# Patient Record
Sex: Female | Born: 1980 | Race: White | Marital: Married | Smoking: Never smoker
Health system: Southern US, Community
[De-identification: ages and names within clinical notes are randomized; demographics above are authoritative.]

## PROBLEM LIST (undated history)

## (undated) DIAGNOSIS — G43909 Migraine, unspecified, not intractable, without status migrainosus: Secondary | ICD-10-CM

## (undated) DIAGNOSIS — E282 Polycystic ovarian syndrome: Secondary | ICD-10-CM

## (undated) HISTORY — PX: CHOLECYSTECTOMY: SHX55

## (undated) HISTORY — PX: TONSILLECTOMY: SUR1361

---

## 2007-07-21 HISTORY — PX: NASAL SINUS SURGERY: SHX719

## 2009-07-20 HISTORY — PX: TUBAL LIGATION: SHX77

## 2009-07-20 HISTORY — PX: WRIST FRACTURE SURGERY: SHX121

## 2013-08-02 ENCOUNTER — Other Ambulatory Visit: Payer: Self-pay | Admitting: Family Medicine

## 2013-08-02 DIAGNOSIS — R51 Headache: Secondary | ICD-10-CM

## 2013-08-02 DIAGNOSIS — R519 Headache, unspecified: Secondary | ICD-10-CM

## 2013-08-02 DIAGNOSIS — H9319 Tinnitus, unspecified ear: Secondary | ICD-10-CM

## 2013-08-02 DIAGNOSIS — R42 Dizziness and giddiness: Secondary | ICD-10-CM

## 2013-08-07 ENCOUNTER — Other Ambulatory Visit: Payer: Self-pay

## 2013-08-09 ENCOUNTER — Ambulatory Visit
Admission: RE | Admit: 2013-08-09 | Discharge: 2013-08-09 | Disposition: A | Discharge status: Home / Self Care | Payer: 59 | Source: Ambulatory Visit | Attending: Family Medicine | Admitting: Family Medicine

## 2013-08-09 DIAGNOSIS — H9319 Tinnitus, unspecified ear: Secondary | ICD-10-CM

## 2013-08-09 DIAGNOSIS — R51 Headache: Secondary | ICD-10-CM

## 2013-08-09 DIAGNOSIS — R42 Dizziness and giddiness: Secondary | ICD-10-CM

## 2013-08-09 DIAGNOSIS — R519 Headache, unspecified: Secondary | ICD-10-CM

## 2013-08-09 MED ORDER — GADOBENATE DIMEGLUMINE 529 MG/ML IV SOLN
13.0000 mL | Freq: Once | INTRAVENOUS | Status: AC | PRN
  Administered 2013-08-09: 13 mL via INTRAVENOUS

## 2016-04-25 ENCOUNTER — Encounter (HOSPITAL_BASED_OUTPATIENT_CLINIC_OR_DEPARTMENT_OTHER): Payer: Self-pay | Admitting: *Deleted

## 2016-04-25 ENCOUNTER — Emergency Department (HOSPITAL_BASED_OUTPATIENT_CLINIC_OR_DEPARTMENT_OTHER)
Admission: EM | Admit: 2016-04-25 | Discharge: 2016-04-25 | Disposition: A | Discharge status: Home / Self Care | Payer: 59 | Attending: Emergency Medicine | Admitting: Emergency Medicine

## 2016-04-25 ENCOUNTER — Emergency Department (HOSPITAL_BASED_OUTPATIENT_CLINIC_OR_DEPARTMENT_OTHER): Payer: 59

## 2016-04-25 DIAGNOSIS — R109 Unspecified abdominal pain: Secondary | ICD-10-CM | POA: Diagnosis present

## 2016-04-25 DIAGNOSIS — K529 Noninfective gastroenteritis and colitis, unspecified: Secondary | ICD-10-CM

## 2016-04-25 DIAGNOSIS — Z79899 Other long term (current) drug therapy: Secondary | ICD-10-CM | POA: Insufficient documentation

## 2016-04-25 HISTORY — DX: Migraine, unspecified, not intractable, without status migrainosus: G43.909

## 2016-04-25 HISTORY — DX: Polycystic ovarian syndrome: E28.2

## 2016-04-25 LAB — COMPREHENSIVE METABOLIC PANEL
ALBUMIN: 4.2 g/dL (ref 3.5–5.0)
ALT: 31 U/L (ref 14–54)
ANION GAP: 8 (ref 5–15)
AST: 28 U/L (ref 15–41)
Alkaline Phosphatase: 40 U/L (ref 38–126)
BILIRUBIN TOTAL: 0.9 mg/dL (ref 0.3–1.2)
BUN: 7 mg/dL (ref 6–20)
CO2: 26 mmol/L (ref 22–32)
Calcium: 8.8 mg/dL — ABNORMAL LOW (ref 8.9–10.3)
Chloride: 105 mmol/L (ref 101–111)
Creatinine, Ser: 0.62 mg/dL (ref 0.44–1.00)
GFR calc Af Amer: >60 mL/min (ref >60)
GFR calc non Af Amer: >60 mL/min (ref >60)
GLUCOSE: 90 mg/dL (ref 65–99)
POTASSIUM: 3.6 mmol/L (ref 3.5–5.1)
SODIUM: 139 mmol/L (ref 135–145)
TOTAL PROTEIN: 6.9 g/dL (ref 6.5–8.1)

## 2016-04-25 LAB — CBC
HEMATOCRIT: 38.9 % (ref 36.0–46.0)
HEMOGLOBIN: 13.5 g/dL (ref 12.0–15.0)
MCH: 30.9 pg (ref 26.0–34.0)
MCHC: 34.7 g/dL (ref 30.0–36.0)
MCV: 89 fL (ref 78.0–100.0)
Platelets: 265 10*3/uL (ref 150–400)
RBC: 4.37 MIL/uL (ref 3.87–5.11)
RDW: 11.8 % (ref 11.5–15.5)
WBC: 5.3 10*3/uL (ref 4.0–10.5)

## 2016-04-25 LAB — URINALYSIS, ROUTINE W REFLEX MICROSCOPIC
BILIRUBIN URINE: NEGATIVE
Glucose, UA: NEGATIVE mg/dL
Hgb urine dipstick: NEGATIVE
Ketones, ur: NEGATIVE mg/dL
Leukocytes, UA: NEGATIVE
NITRITE: NEGATIVE
PH: 6 (ref 5.0–8.0)
PROTEIN: NEGATIVE mg/dL
SPECIFIC GRAVITY, URINE: 1.007 (ref 1.005–1.030)

## 2016-04-25 LAB — LIPASE, BLOOD: Lipase: 21 U/L (ref 11–51)

## 2016-04-25 LAB — PREGNANCY, URINE: Preg Test, Ur: NEGATIVE

## 2016-04-25 MED ORDER — PROMETHAZINE HCL 25 MG PO TABS: 25.0000 mg | ORAL_TABLET | Freq: Three times a day (TID) | ORAL | 0 refills | Status: AC | PRN

## 2016-04-25 MED ORDER — HYDROCODONE-ACETAMINOPHEN 5-325 MG PO TABS: 1.0000 | ORAL_TABLET | Freq: Four times a day (QID) | ORAL | 0 refills | Status: AC | PRN

## 2016-04-25 MED ORDER — ONDANSETRON HCL 4 MG/2ML IJ SOLN
4.0000 mg | Freq: Once | INTRAMUSCULAR | Status: AC
  Administered 2016-04-25: 4 mg via INTRAVENOUS
  Filled 2016-04-25: qty 2

## 2016-04-25 MED ORDER — SODIUM CHLORIDE 0.9 % IV BOLUS (SEPSIS)
1000.0000 mL | Freq: Once | INTRAVENOUS | Status: AC
  Administered 2016-04-25: 1000 mL via INTRAVENOUS

## 2016-04-25 MED ORDER — SUCRALFATE 1 G PO TABS: 1.0000 g | ORAL_TABLET | Freq: Three times a day (TID) | ORAL | 0 refills | Status: AC

## 2016-04-25 MED ORDER — MORPHINE SULFATE (PF) 4 MG/ML IV SOLN
4.0000 mg | Freq: Once | INTRAVENOUS | Status: AC
  Administered 2016-04-25: 4 mg via INTRAVENOUS
  Filled 2016-04-25: qty 1

## 2016-04-25 MED ORDER — IOPAMIDOL (ISOVUE-300) INJECTION 61%
100.0000 mL | Freq: Once | INTRAVENOUS | Status: AC | PRN
  Administered 2016-04-25: 100 mL via INTRAVENOUS

## 2016-04-25 NOTE — ED Triage Notes (Signed)
Pt reports mid-upper abd pain that radiates to periumbilical area. States it's been intermittent x1wk with associated diarrhea. Denies fever; reports n/v x1 this am.

## 2016-04-25 NOTE — ED Provider Notes (Signed)
[facility]   CSN: [id number] Arrival date & time: 04/25/16  1032     History   Chief Complaint Chief Complaint  Patient presents with  . Abdominal Pain    HPI Barbara Austin is a 35 y.o. female.  HPI Patient presents to the emergency department with nausea, vomiting with abdominal pain over the last 5 days.  The patient states that she was seen by her GYN doctor and prescribed a medication for a yeast infection vaginally.  The patient states that she saw her primary care doctor also prescribed her something for GERD-like symptoms.  Patient states that nothing seems to make her condition better or worse.  She states that she did have an episode of vomiting this morning, along with increasing pain Past Medical History:  Diagnosis Date  . Migraines   . Polycystic ovary disease     There are no active problems to display for this patient.   Past Surgical History:  Procedure Laterality Date  . CHOLECYSTECTOMY    . NASAL SINUS SURGERY  2009  . TONSILLECTOMY    . TUBAL LIGATION  2011  . WRIST FRACTURE SURGERY Right 2011    OB History    No data available       Home Medications    Prior to Admission medications   Medication Sig Start Date End Date Taking? Authorizing Provider  fexofenadine (ALLEGRA) 30 MG tablet Take 30 mg by mouth 2 (two) times daily.   Yes Historical Provider, MD  omeprazole (PRILOSEC) 20 MG capsule Take 20 mg by mouth daily.   Yes Historical Provider, MD    Family History No family history on file.  Social History Social History  Substance Use Topics  . Smoking status: Never Smoker  . Smokeless tobacco: Never Used  . Alcohol use Yes     Comment: every other day     Allergies   Penicillins   Review of Systems Review of Systems  All other systems negative except as documented in the HPI. All pertinent positives and negatives as reviewed in the HPI. Physical Exam Updated Vital Signs BP 119/73 (BP Location:  Left Arm)   Pulse 64   Temp 98.3 F (36.8 C) (Oral)   Resp 18   Ht 5\' 4"  (1.626 m)   Wt 65.3 kg   LMP 03/25/2016   SpO2 98%   BMI 24.72 kg/m   Physical Exam  Constitutional: She is oriented to person, place, and time. She appears well-developed and well-nourished. No distress.  HENT:  Head: Normocephalic and atraumatic.  Mouth/Throat: Oropharynx is clear and moist.  Eyes: Pupils are equal, round, and reactive to light.  Neck: Normal range of motion. Neck supple.  Cardiovascular: Normal rate, regular rhythm and normal heart sounds.  Exam reveals no gallop and no friction rub.   No murmur heard. Pulmonary/Chest: Effort normal and breath sounds normal. No respiratory distress. She has no wheezes.  Abdominal: Soft. Bowel sounds are normal. She exhibits no distension and no mass. There is tenderness. There is no rebound and no guarding.  Neurological: She is alert and oriented to person, place, and time. She exhibits normal muscle tone. Coordination normal.  Skin: Skin is warm and dry. Capillary refill takes less than 2 seconds. No rash noted. No erythema.  Psychiatric: She has a normal mood and affect. Her behavior is normal.  Nursing note and vitals reviewed.    ED Treatments / Results  Labs (all labs ordered are listed, but only  abnormal results are displayed) Labs Reviewed  COMPREHENSIVE METABOLIC PANEL - Abnormal; Notable for the following:       Result Value   Calcium 8.8 (*)    All other components within normal limits  LIPASE, BLOOD  CBC  URINALYSIS, ROUTINE W REFLEX MICROSCOPIC (NOT AT [id number])  PREGNANCY, URINE    EKG  EKG Interpretation None       Radiology Ct Abdomen Pelvis W Contrast  Result Date: 04/25/2016 CLINICAL DATA:  Periumbilical and epigastric abdominal pain, nausea and vomiting. EXAM: CT ABDOMEN AND PELVIS WITH CONTRAST TECHNIQUE: Multidetector CT imaging of the abdomen and pelvis was performed using the standard protocol following bolus  administration of intravenous contrast. CONTRAST:  100mL ISOVUE-300 IOPAMIDOL (ISOVUE-300) INJECTION 61% COMPARISON:  None. FINDINGS: Lower chest: No acute abnormality. Hepatobiliary: No focal liver abnormality is seen. Status post cholecystectomy. No biliary dilatation. Pancreas: Unremarkable. No pancreatic ductal dilatation or surrounding inflammatory changes. Spleen: Normal in size without focal abnormality. Adrenals/Urinary Tract: Adrenal glands are unremarkable. Kidneys are normal, without renal calculi, focal lesion, or hydronephrosis. Bladder is unremarkable. Stomach/Bowel: Stomach is within normal limits. Appendix appears normal. No evidence of bowel wall thickening, distention, or obstruction. No free air or abscess. Nonspecific fluid in some nondilated mid to distal small bowel loops may be reflective of mild enteritis. Vascular/Lymphatic: No significant vascular findings are present. No enlarged abdominal or pelvic lymph nodes. Reproductive: Uterus and bilateral adnexa are unremarkable. Migrated right-sided tubal ligation clip lies in the anterior pelvis. Other: No abdominal wall hernia or abnormality. No abdominopelvic ascites. Musculoskeletal: No acute or significant osseous findings. IMPRESSION: No significant acute process identified in the abdomen or pelvis. Mild fluid filled nondilated small bowel may be reflective of enteritis. Electronically Signed   By: Irish LackGlenn  Yamagata M.D.   On: 04/25/2016 13:18    Procedures Procedures (including critical care time)  Medications Ordered in ED Medications  sodium chloride 0.9 % bolus 1,000 mL (1,000 mLs Intravenous New Bag/Given 04/25/16 1202)  ondansetron (ZOFRAN) injection 4 mg (4 mg Intravenous Given 04/25/16 1203)  morphine 4 MG/ML injection 4 mg (4 mg Intravenous Given 04/25/16 1204)  iopamidol (ISOVUE-300) 61 % injection 100 mL (100 mLs Intravenous Contrast Given 04/25/16 1229)     Initial Impression / Assessment and Plan / ED Course  I have  reviewed the triage vital signs and the nursing notes.  Pertinent labs & imaging results that were available during my care of the patient were reviewed by me and considered in my medical decision making (see chart for details).  Clinical Course   The patient is advised of the plan and all questions answered.  Did advise her that this could just be a gastroenteritis, although she could have a worsening condition that is yet to fully declare itself and told her to return here for any changes in her condition.  Patient be sent home with symptomatic control told to follow with her primary care doctor.  Patient voices an understanding and all questions were answered  Final Clinical Impressions(s) / ED Diagnoses   Final diagnoses:  None    New Prescriptions New Prescriptions   No medications on file     Charlestine NightChristopher Leaann Nevils, PA-C 04/25/16 1644    Pricilla LovelessScott Goldston, MD 05/01/16 1739

## 2016-04-25 NOTE — Discharge Instructions (Signed)
Follow-up with your primary care doctor.  Return here as needed for any worsening in your condition slowly increase her fluid intake and rest as much as possible

## 2016-04-25 NOTE — ED Notes (Signed)
PA at bedside.

## 2018-02-18 ENCOUNTER — Other Ambulatory Visit: Payer: Self-pay | Admitting: Orthopedic Surgery

## 2018-02-18 DIAGNOSIS — M5416 Radiculopathy, lumbar region: Secondary | ICD-10-CM

## 2018-03-02 ENCOUNTER — Other Ambulatory Visit: Payer: 59

## 2018-03-11 ENCOUNTER — Other Ambulatory Visit: Payer: Self-pay

## 2018-03-11 ENCOUNTER — Inpatient Hospital Stay: Admission: RE | Admit: 2018-03-11 | Payer: Self-pay | Source: Ambulatory Visit

## 2018-03-24 ENCOUNTER — Other Ambulatory Visit: Payer: Self-pay

## 2018-04-15 ENCOUNTER — Other Ambulatory Visit: Payer: Self-pay | Admitting: Orthopedic Surgery

## 2018-04-15 DIAGNOSIS — M5416 Radiculopathy, lumbar region: Secondary | ICD-10-CM

## 2018-04-29 ENCOUNTER — Ambulatory Visit
Admission: RE | Admit: 2018-04-29 | Discharge: 2018-04-29 | Disposition: A | Discharge status: Home / Self Care | Payer: Managed Care, Other (non HMO) | Source: Ambulatory Visit | Attending: Orthopedic Surgery | Admitting: Orthopedic Surgery

## 2018-04-29 DIAGNOSIS — M5416 Radiculopathy, lumbar region: Secondary | ICD-10-CM

## 2019-03-31 IMAGING — MR MR LUMBAR SPINE W/O CM
4 of 5 series · 26 of 48 positions shown · non-contrast
Comparison: None.

CLINICAL DATA: Initial evaluation for centralized low back pain
radiating into the left lower extremity for 10 months.

EXAM:
MRI LUMBAR SPINE WITHOUT CONTRAST
TECHNIQUE: Multiplanar, multisequence MR imaging of the lumbar spine was
performed. No intravenous contrast was administered.

[Series 3: T2 · sagittal · 4.0mm · 0.55mm/px · 6 of 12 slices shown (1 of 2)]
[im 1/12]
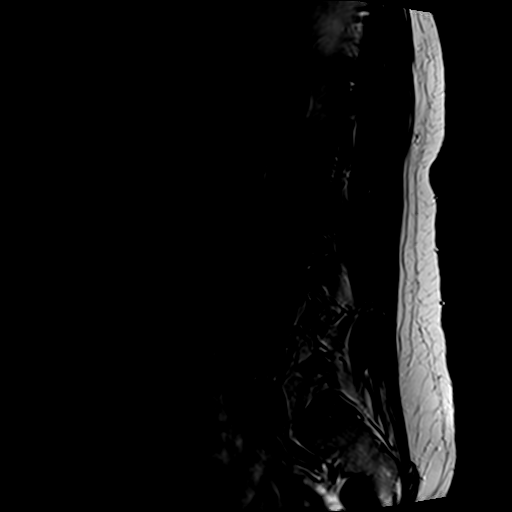
[im 3/12]
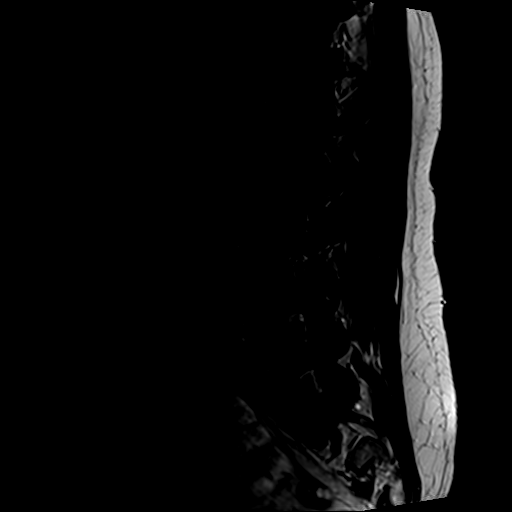
[im 5/12]
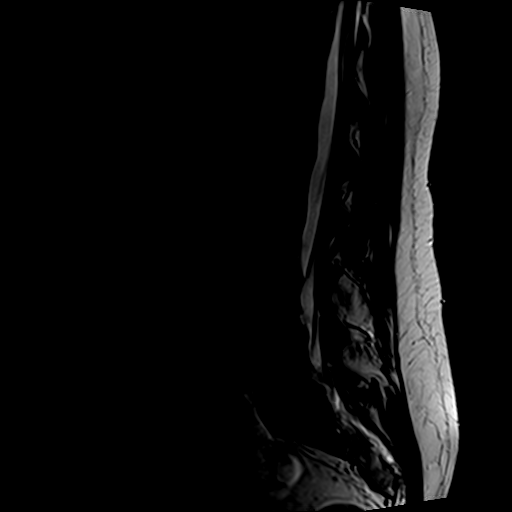
[im 7/12]
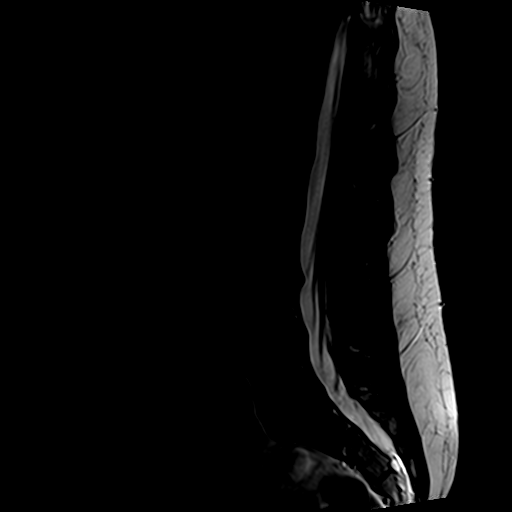
[im 9/12]
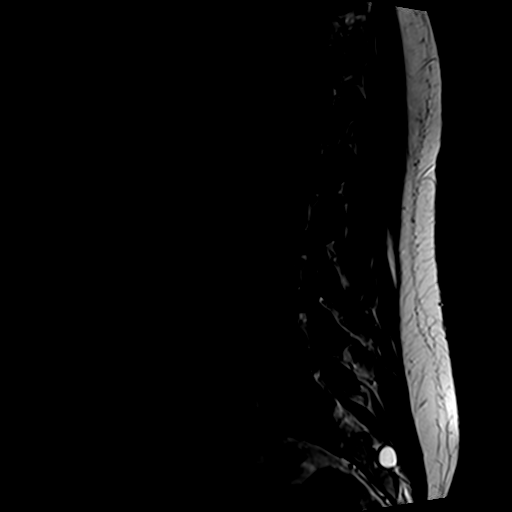
[im 12/12]
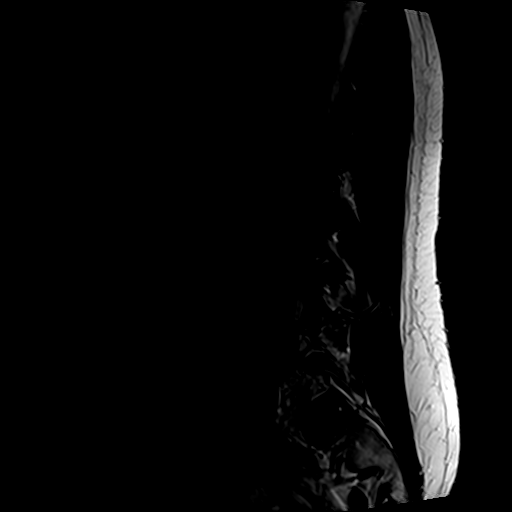

[Series 4: T1 · sagittal · 4.0mm · 0.55mm/px · 5 of 12 slices shown (1 of 2)]
[im 1/12]
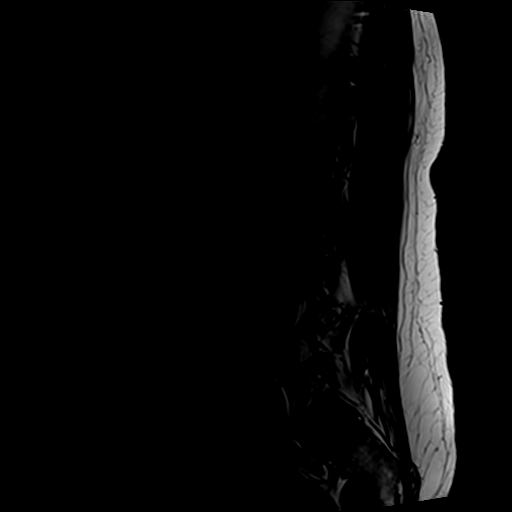
[im 3/12]
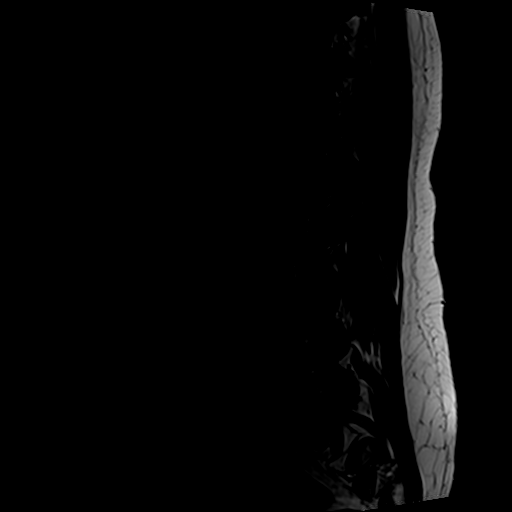
[im 6/12]
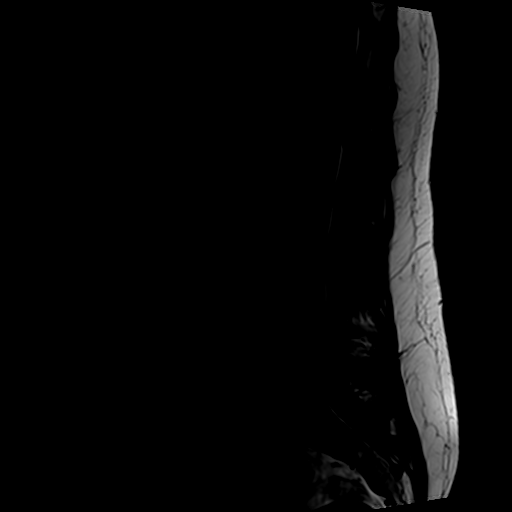
[im 9/12]
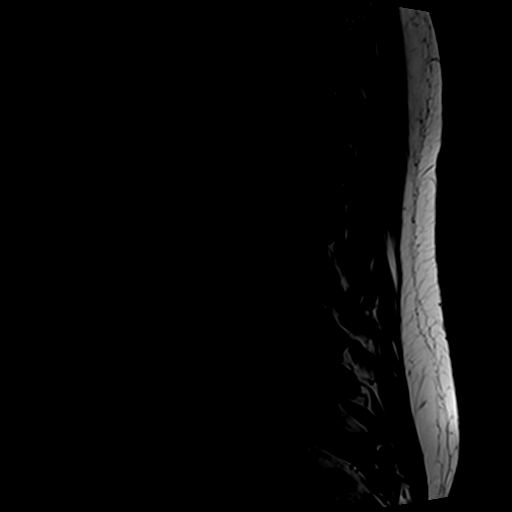
[im 12/12]
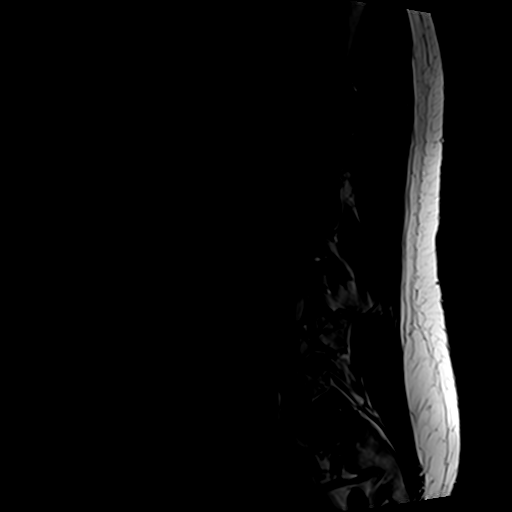

[Series 6: T2 · axial · 4.0mm · 0.70mm/px · z∈[-90,+125]mm · 10 of 35 slices shown (2 of 2)]
[im 3/35]
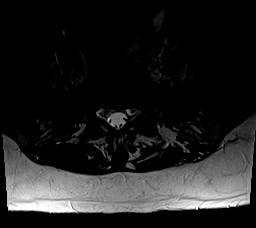
[im 5/35]
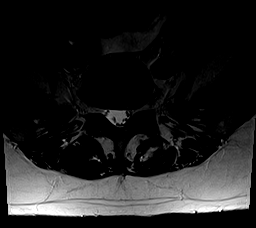
[im 7/35]
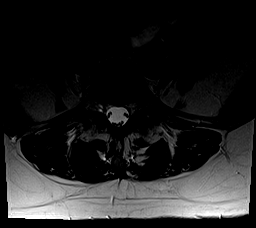
[im 12/35]
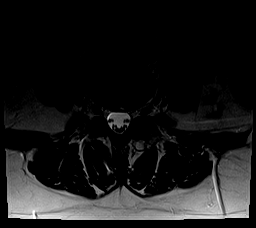
[im 16/35]
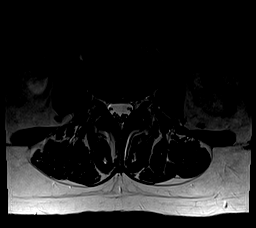
[im 19/35]
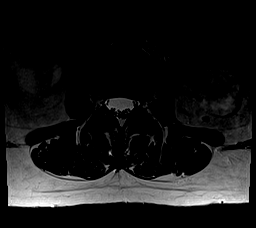
[im 21/35]
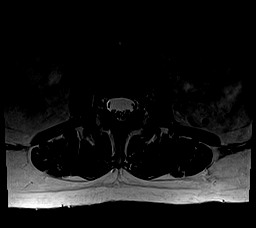
[im 25/35]
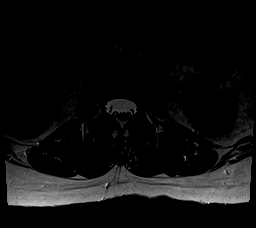
[im 30/35]
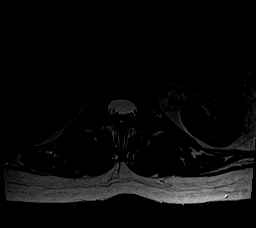
[im 35/35]
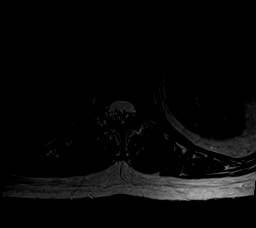

[Series 7: T1 · axial · 4.0mm · 0.35mm/px · z∈[-90,+100]mm · 5 of 35 slices shown (2 of 2)]
[im 3/35]
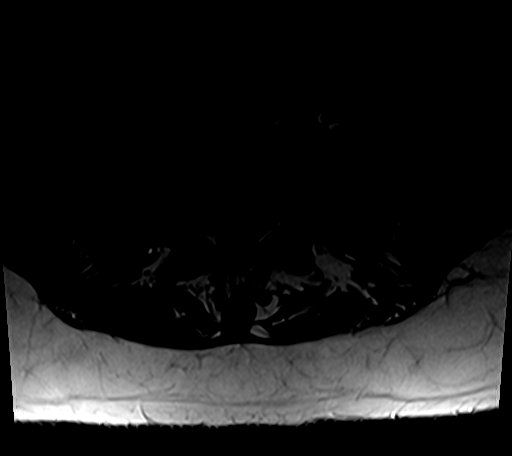
[im 5/35]
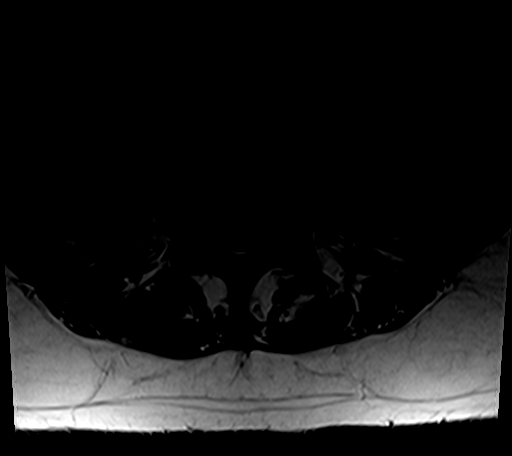
[im 7/35]
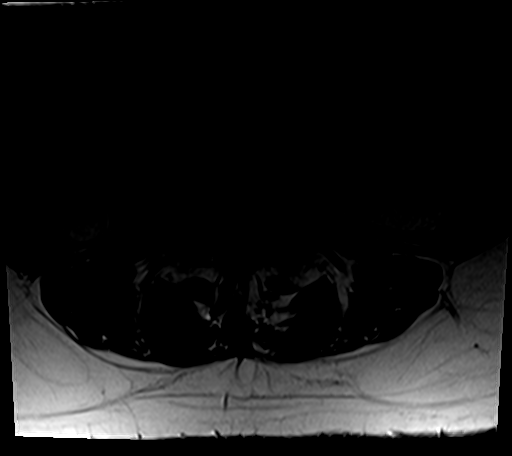
[im 19/35]
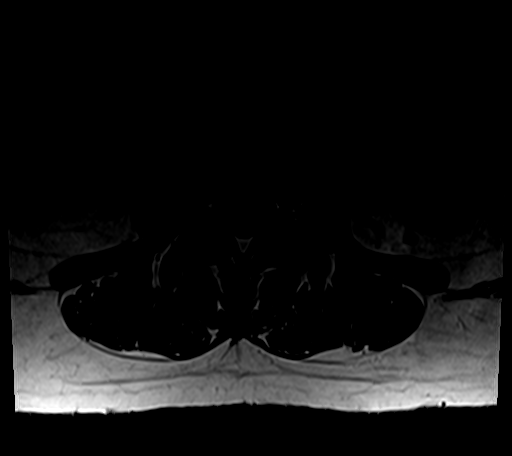
[im 30/35]
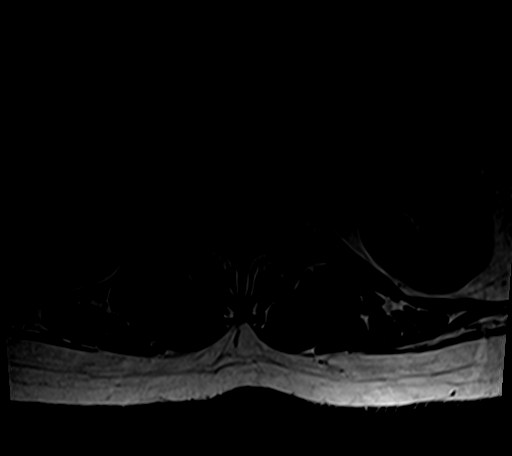

[26 of 48 positions shown; findings below may reference images not displayed]

FINDINGS: Segmentation: Normal segmentation. Lowest well-formed disc labeled
the L5-S1 level.

Alignment: Trace 2 mm anterolisthesis of L4 on L5, with trace
retrolisthesis of L3 on L4. Alignment otherwise normal with
preservation of the normal lumbar lordosis.

Vertebrae: Vertebral body heights maintained without evidence for
acute or chronic fracture. Bone marrow signal intensity within
normal limits. No discrete or worrisome osseous lesions. No abnormal
marrow edema.

Conus medullaris and cauda equina: Conus extends to the T12 level.
Conus and cauda equina appear normal.

Paraspinal and other soft tissues: Visualized paraspinous soft
tissues within normal limits. Visualized visceral structures normal.

Disc levels:

L1-2:  Unremarkable.

L2-3:  Minimal facet hypertrophy.  No stenosis.

L3-4: Diffuse disc bulge with disc desiccation and intervertebral
disc space narrowing. Central annular fissure. Resultant mild
bilateral lateral recess narrowing, slightly worse on the right.
Central canal remains patent. No impingement.

L4-5: Diffuse disc bulge with disc desiccation and intervertebral
disc space narrowing. Central annular fissure. Mild left-sided facet
hypertrophy with resultant mild left lateral recess narrowing.
Central canal remains patent. No significant foraminal encroachment.

L5-S1: Shallow posterior disc bulge. Small left subarticular annular
fissure, closely approximating and could potentially irritate the
transiting left S1 nerve root (series 6, image 31). No significant
stenosis. Foramina remain patent.
IMPRESSION: 1. Minimal disc bulge with associated left subarticular annular
fissure at L5-S1, which could potentially irritate the adjacent
descending left S1 nerve root.
2. Mild disc bulge with left-sided facet hypertrophy at L4-5 with
resultant mild left lateral recess narrowing.
3. Disc bulge at L3-4 with resultant mild bilateral subarticular
stenosis.
4.

## 2019-08-15 ENCOUNTER — Ambulatory Visit: Payer: Managed Care, Other (non HMO)

## 2019-08-24 ENCOUNTER — Ambulatory Visit: Payer: Managed Care, Other (non HMO) | Attending: Internal Medicine

## 2019-08-24 DIAGNOSIS — Z23 Encounter for immunization: Secondary | ICD-10-CM | POA: Insufficient documentation

## 2019-08-24 NOTE — Progress Notes (Signed)
   Covid-19 Vaccination Clinic  Name:  Barbara Austin    MRN: [id number] DOB: 1980-10-28  08/24/2019  Barbara Austin was observed post Covid-19 immunization for 15 minutes without incidence. She was provided with Vaccine Information Sheet and instruction to access the V-Safe system.   Barbara Austin was instructed to call 911 with any severe reactions post vaccine: Marland Kitchen Difficulty breathing  . Swelling of your face and throat  . A fast heartbeat  . A bad rash all over your body  . Dizziness and weakness    Immunizations Administered    Name Date Dose VIS Date Route   Pfizer COVID-19 Vaccine 08/24/2019  1:07 PM 0.3 mL 06/30/2019 Intramuscular   Manufacturer: [employer], [employer]   Lot: [id number]   NDC: [address]-7

## 2019-09-01 ENCOUNTER — Ambulatory Visit: Payer: Managed Care, Other (non HMO)

## 2019-09-18 ENCOUNTER — Ambulatory Visit: Payer: Managed Care, Other (non HMO) | Attending: Internal Medicine

## 2019-09-18 DIAGNOSIS — Z23 Encounter for immunization: Secondary | ICD-10-CM | POA: Insufficient documentation

## 2019-09-18 NOTE — Progress Notes (Signed)
   Covid-19 Vaccination Clinic  Name:  Dion Parrow    MRN: [id number] DOB: September 15, 1980  09/18/2019  Ms. Paolo was observed post Covid-19 immunization for 15 minutes without incidence. She was provided with Vaccine Information Sheet and instruction to access the V-Safe system.   Ms. Bromell was instructed to call 911 with any severe reactions post vaccine: Marland Kitchen Difficulty breathing  . Swelling of your face and throat  . A fast heartbeat  . A bad rash all over your body  . Dizziness and weakness    Immunizations Administered    Name Date Dose VIS Date Route   Pfizer COVID-19 Vaccine 09/18/2019 11:23 AM 0.3 mL 06/30/2019 Intramuscular   Manufacturer: [employer], [employer]   Lot: [id number]   NDC: [address]-3

## 2019-12-02 ENCOUNTER — Ambulatory Visit: Payer: Managed Care, Other (non HMO)

## 2020-01-31 ENCOUNTER — Other Ambulatory Visit: Payer: Managed Care, Other (non HMO)

## 2023-01-25 ENCOUNTER — Emergency Department (HOSPITAL_BASED_OUTPATIENT_CLINIC_OR_DEPARTMENT_OTHER): Payer: 59

## 2023-01-25 ENCOUNTER — Other Ambulatory Visit: Payer: Self-pay

## 2023-01-25 ENCOUNTER — Encounter (HOSPITAL_BASED_OUTPATIENT_CLINIC_OR_DEPARTMENT_OTHER): Payer: Self-pay

## 2023-01-25 ENCOUNTER — Emergency Department (HOSPITAL_BASED_OUTPATIENT_CLINIC_OR_DEPARTMENT_OTHER)
Admission: EM | Admit: 2023-01-25 | Discharge: 2023-01-25 | Disposition: A | Discharge status: Home / Self Care | Payer: 59 | Attending: Emergency Medicine | Admitting: Emergency Medicine

## 2023-01-25 DIAGNOSIS — S0990XA Unspecified injury of head, initial encounter: Secondary | ICD-10-CM | POA: Diagnosis not present

## 2023-01-25 MED ORDER — IBUPROFEN 400 MG PO TABS: 600.0000 mg | ORAL_TABLET | Freq: Once | ORAL | Status: DC

## 2023-01-25 NOTE — ED Notes (Signed)
Pt discharged to home using teachback Method. Discharge instructions have been discussed with patient and/or family members. Pt verbally acknowledges understanding d/c instructions, has been given opportunity for questions to be answered, and endorses comprehension to checkout at registration before leaving.  
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
## 2023-01-25 NOTE — ED Notes (Signed)
Patient transported to CT 

## 2023-01-25 NOTE — Discharge Instructions (Signed)
As discussed, CT study of your head did not show signs of fracture, bleed or other intracranial abnormality.  Recommend continued use of Tylenol/Motrin at home as needed for pain.  Recommend follow-up with primary care for reassessment of your symptoms.  Please do not hesitate to return to emergency department for worrisome signs and symptoms we discussed become apparent.
